# Patient Record
Sex: Female | Born: 1985 | Race: Asian | Hispanic: No | Smoking: Never smoker
Health system: Southern US, Community
[De-identification: ages and names within clinical notes are randomized; demographics above are authoritative.]

## PROBLEM LIST (undated history)

## (undated) DIAGNOSIS — T7840XA Allergy, unspecified, initial encounter: Secondary | ICD-10-CM

## (undated) HISTORY — DX: Allergy, unspecified, initial encounter: T78.40XA

---

## 2013-05-28 ENCOUNTER — Ambulatory Visit (INDEPENDENT_AMBULATORY_CARE_PROVIDER_SITE_OTHER): Payer: Managed Care, Other (non HMO) | Admitting: Emergency Medicine

## 2013-05-28 VITALS — BP 110/70 | HR 80 | Temp 97.9°F | Resp 18 | Ht 66.0 in | Wt 126.0 lb

## 2013-05-28 DIAGNOSIS — K1120 Sialoadenitis, unspecified: Secondary | ICD-10-CM

## 2013-05-28 LAB — POCT CBC
Granulocyte percent: 55.6 %G (ref 37–80)
Hemoglobin: 13.1 g/dL (ref 12.2–16.2)
MCH, POC: 31 pg (ref 27–31.2)
MID (cbc): 0.3 (ref 0–0.9)
MPV: 9.4 fL (ref 0–99.8)
POC MID %: 6.9 %M (ref 0–12)
Platelet Count, POC: 139 10*3/uL — AB (ref 142–424)
RBC: 4.23 M/uL (ref 4.04–5.48)
WBC: 4 10*3/uL — AB (ref 4.6–10.2)

## 2013-05-28 NOTE — Patient Instructions (Addendum)
Salivary Gland Infection  A salivary gland infection can be caused by a virus, bacteria from the mouth, or a stone. Mumps and other viruses may settle in one or more of the saliva glands. This will result in swelling, pain, and difficulty eating. Bacteria may cause a more severe infection in a salivary gland. A salivary stone blocking the flow of saliva can make this worse. These infections may be related to other medical problems. Some of these are dehydration, recent surgery, poor nutrition, and some medications.  TREATMENT   Treatment of a salivary gland infection depends on the cause. Mumps and other virus infections do not require antibiotics. If bacteria cause the infection, then antibiotics are needed to get rid of the infection. If there is a salivary stone blocking the duct, minor surgery to remove the stone may be needed.   HOME CARE INSTRUCTIONS    Get plenty of rest, increase your fluids, and use warm compresses on the swollen area for 15 to 20 minutes 4 times per day or as often as feels good to you.   Suck on hard candy or chew sugarless gum to promote saliva production.   Only take over-the-counter or prescription medicines for pain, discomfort, or fever as directed by your caregiver.  SEEK IMMEDIATE MEDICAL CARE IF:    You have increased swelling or pain or pain not relieved with medications.   You develop chills or a fever.   Any of your problems are getting worse rather than better.  Document Released: 12/25/2004 Document Revised: 02/09/2012 Document Reviewed: 11/17/2005  [employer] Patient Information 2014 [employer].

## 2013-05-28 NOTE — Progress Notes (Signed)
  Subjective:    Patient ID: Erica Mercado, female    DOB: January 08, 1986, 27 y.o.   MRN: [id number]  HPI 27 year old Pt presents to clinic today with Bil pain and swelling behind both ears. This began yesterday morning. No sick contacts at home. Pt moved from [place] almost 5 years ago. She does not recall is she was immunized for the Mumps.    Review of Systems     Objective:   Physical Exam parotid glands are swollen and tender. The TMs are clear. Throat is normal. The neck is otherwise supple. Chest is clear   Results for orders placed in visit on 05/28/13  POCT CBC      Result Value Range   WBC 4.0 (*) 4.6 - 10.2 K/uL   Lymph, poc 1.5  0.6 - 3.4   POC LYMPH PERCENT 37.5  10 - 50 %L   MID (cbc) 0.3  0 - 0.9   POC MID % 6.9  0 - 12 %M   POC Granulocyte 2.2  2 - 6.9   Granulocyte percent 55.6  37 - 80 %G   RBC 4.23  4.04 - 5.48 M/uL   Hemoglobin 13.1  12.2 - 16.2 g/dL   HCT, POC 40.9  81.1 - 47.9 %   MCV 96.8  80 - 97 fL   MCH, POC 31.0  27 - 31.2 pg   MCHC 32.0  31.8 - 35.4 g/dL   RDW, POC 91.4     Platelet Count, POC 139 (*) 142 - 424 K/uL   MPV 9.4  0 - 99.8 fL  POCT RAPID STREP A (OFFICE)      Result Value Range   Rapid Strep A Screen Negative  Negative       Assessment & Plan:  Patient here with bilateral parotiditis she is from [place] and has been here for 5 years. She is unclear about what immunizations she has had. Routine labs were done to include a CBC and an IgM for mumps

## 2013-06-01 LAB — MUMPS ANTIBODY, IGM: Mumps IgM Value: <1:20 {titer}

## 2013-07-04 ENCOUNTER — Ambulatory Visit: Payer: Managed Care, Other (non HMO) | Admitting: Physician Assistant

## 2013-07-04 VITALS — BP 116/70 | HR 65 | Temp 97.5°F | Resp 18 | Ht 66.0 in | Wt 128.0 lb

## 2013-07-04 DIAGNOSIS — L500 Allergic urticaria: Secondary | ICD-10-CM

## 2013-07-04 DIAGNOSIS — L299 Pruritus, unspecified: Secondary | ICD-10-CM

## 2013-07-04 MED ORDER — PREDNISONE 20 MG PO TABS: ORAL_TABLET | ORAL | Status: DC

## 2013-07-04 NOTE — Progress Notes (Signed)
Patient ID: Erica Mercado MRN: [id number], DOB: [birth year], 27 y.o. Date of Encounter: 07/04/2013, 8:19 PM  Primary Physician: No PCP Per Patient  Chief Complaint: Rash x 2 weeks   HPI: 27 y.o. female with history below presents with pruritic rash x 2 weeks. Patient states rash first appeared along her bilateral ears after eating some fish roe that had been shipped to her from her home in [place]. The rash then resolved and she again ate more of the fish roe recently prompting the rash to develop that brought her in tonight. This rash has waxed and waned for several days. It is pruritic in nature. She note lesions along her back, upper abdomen, arms, and sometimes her eyelid, and lower lip. Never with difficulty breathing or swallowing. No new medications, soaps, or detergents. No new clothing. No pets. No one else she is around has a rash. She takes Benadryl for the rash, it helps for the short term, then it returns. Afebrile. No chills. No recent illnesses. Generally healthy.    Past Medical History  Diagnosis Date  . Allergy      Home Meds: Prior to Admission medications   Medication Sig Start Date End Date Taking? Authorizing Provider           Allergies: No Known Allergies  History   Social History  . Marital Status: Unknown    Spouse Name: N/A    Number of Children: N/A  . Years of Education: N/A   Occupational History  . Not on file.   Social History Main Topics  . Smoking status: Never Smoker   . Smokeless tobacco: Not on file  . Alcohol Use: No  . Drug Use: No  . Sexually Active: Not on file   Other Topics Concern  . Not on file   Social History Narrative  . No narrative on file     Review of Systems: Constitutional: negative for chills, fever, or fatigue  HEENT: negative for vision changes, hearing loss, congestion, rhinorrhea, ST, or sinus pressure Cardiovascular: negative for chest pain or palpitations Respiratory: negative for wheezing, shortness of  breath, or cough Abdominal: negative for abdominal pain, nausea, vomiting, or diarrhea Dermatological: See above Neurologic: negative for headache   Physical Exam: Blood pressure 116/70, pulse 65, temperature 97.5 F (36.4 C), temperature source Oral, resp. rate 18, height 5\' 6"  (1.676 m), weight 128 lb (58.06 kg), last menstrual period 06/27/2013, SpO2 100.00%., Body mass index is 20.67 kg/(m^2). General: Well developed, well nourished, in no acute distress. Head: Normocephalic, atraumatic, eyes without discharge, sclera non-icteric, nares are without discharge. Bilateral auditory canals clear, TM's are without perforation, pearly grey and translucent with reflective cone of light bilaterally. Oral cavity moist, posterior pharynx without exudate, erythema, peritonsillar abscess, or post nasal drip.  Neck: Supple. No thyromegaly. Full ROM. No lymphadenopathy. Lungs: Clear bilaterally to auscultation without wheezes, rales, or rhonchi. Breathing is unlabored. Heart: RRR with S1 S2. No murmurs, rubs, or gallops appreciated. Msk:  Strength and tone normal for age. Extremities/Skin: Warm and dry. No clubbing or cyanosis. No edema. Mild urticarial lesions along the posterior chest, isolated lesion on the neck and left arm. Mild angioedema of the lower lip. No swelling or erythema of the tongue or posterior pharynx.   Neuro: Alert and oriented X 3. Moves all extremities spontaneously. Gait is normal. CNII-XII grossly in tact. Psych:  Responds to questions appropriately with a normal affect.     ASSESSMENT AND PLAN:  27 y.o. female with urticaria -  Prednisone 20 mg #18 3x3, 2x3, 1x3 no RF -Avoid fish roe sent from [place]  -RTC precautions   Signed, Eula Listen, PA-C 07/04/2013 8:19 PM

## 2013-07-24 ENCOUNTER — Ambulatory Visit (INDEPENDENT_AMBULATORY_CARE_PROVIDER_SITE_OTHER): Payer: Managed Care, Other (non HMO) | Admitting: Internal Medicine

## 2013-07-24 VITALS — BP 100/52 | HR 77 | Temp 97.9°F | Resp 16 | Ht 66.5 in | Wt 127.2 lb

## 2013-07-24 DIAGNOSIS — L500 Allergic urticaria: Secondary | ICD-10-CM

## 2013-07-24 MED ORDER — PREDNISONE 10 MG PO TABS: ORAL_TABLET | ORAL | Status: DC

## 2013-07-24 MED ORDER — CETIRIZINE HCL 10 MG PO TABS: 10.0000 mg | ORAL_TABLET | Freq: Every day | ORAL | Status: DC

## 2013-07-24 NOTE — Progress Notes (Signed)
  Subjective:    Patient ID: Erica Mercado, female    DOB: 31-Oct-1986, 27 y.o.   MRN: [id number]  HPI She has had a relapse from her last visit of urticarial lesions with lower lip edema She began 4 or 5 days after finishing her steroids No troubles with tongue or with swallowing or breathing No known exposures or foods/no medicines No joint pain No recent illnesses  From [place] Works in a Chief Strategy Officer   Review of Systems Noncontributory    Objective:   Physical Exam BP 100/52  Pulse 77  Temp(Src) 97.9 F (36.6 C) (Oral)  Resp 16  Ht 5' 6.5" (1.689 m)  Wt 127 lb 3.2 oz (57.698 kg)  BMI 20.23 kg/m2  SpO2 100%  LMP 06/27/2013 HEENT clear except for mild swelling of the lower lip Lungs clear Heart regular without murmur Joints are clear Two urticarial lesions on her right hip       Assessment & Plan:  Allergic urticaria  Meds ordered this encounter  Medications  . predniSONE (DELTASONE) 10 MG tablet    Sig: 3/3/2/2/1/1/ then 1/2 tab for 2 more days    Dispense:  13 tablet    Refill:  0  . cetirizine (ZYRTEC) 10 MG tablet    Sig: Take 1 tablet (10 mg total) by mouth daily.    Dispense:  30 tablet    Refill:  11

## 2013-08-10 ENCOUNTER — Ambulatory Visit: Payer: Managed Care, Other (non HMO) | Admitting: Emergency Medicine

## 2013-08-10 VITALS — BP 122/72 | HR 93 | Temp 97.9°F | Resp 18 | Ht 66.0 in | Wt 125.0 lb

## 2013-08-10 DIAGNOSIS — O0001 Abdominal pregnancy with intrauterine pregnancy: Secondary | ICD-10-CM

## 2013-08-10 DIAGNOSIS — N912 Amenorrhea, unspecified: Secondary | ICD-10-CM

## 2013-08-10 LAB — POCT WET PREP WITH KOH
Clue Cells Wet Prep HPF POC: 100
KOH Prep POC: NEGATIVE
Trichomonas, UA: NEGATIVE

## 2013-08-10 LAB — POCT UA - MICROSCOPIC ONLY
Casts, Ur, LPF, POC: NEGATIVE
Crystals, Ur, HPF, POC: NEGATIVE
Yeast, UA: NEGATIVE

## 2013-08-10 LAB — POCT URINE PREGNANCY: Preg Test, Ur: POSITIVE

## 2013-08-10 LAB — POCT URINALYSIS DIPSTICK
Blood, UA: NEGATIVE
Leukocytes, UA: NEGATIVE
Nitrite, UA: NEGATIVE
Protein, UA: NEGATIVE
Urobilinogen, UA: 1
pH, UA: 6

## 2013-08-10 MED ORDER — PRENATAL 27-1 MG PO TABS: 1.0000 | ORAL_TABLET | Freq: Every day | ORAL | Status: DC

## 2013-08-10 NOTE — Progress Notes (Signed)
[facility] [address], [place] [address] [phone]  Date:  08/10/2013   Name:  Erica Mercado   DOB:  1986/05/12   MRN:  [id number]  PCP:  No PCP Per Patient    Chief Complaint: missed cycle 10 days ago and issues with stomach   History of Present Illness:  Erica Mercado is a 27 y.o. very pleasant female patient who presents with the following:  Sexually active and not using contraceptive.  Missed last period 10 days ago.  Nulligravida.  No nausea or vomiting. Has pelvic sensation of "hot" but denies bleeding or discharge.  Difficult to understand if she is complaining of pain.  No improvement with over the counter medications or other home remedies. Denies other complaint or health concern today.   There are no active problems to display for this patient.   Past Medical History  Diagnosis Date  . Allergy     History reviewed. No pertinent past surgical history.  History  Substance Use Topics  . Smoking status: Never Smoker   . Smokeless tobacco: Not on file  . Alcohol Use: No    History reviewed. No pertinent family history.  No Known Allergies  Medication list has been reviewed and updated.  Current Outpatient Prescriptions on File Prior to Visit  Medication Sig Dispense Refill  . cetirizine (ZYRTEC) 10 MG tablet Take 1 tablet (10 mg total) by mouth daily.  30 tablet  11  . predniSONE (DELTASONE) 10 MG tablet 3/3/2/2/1/1/ then 1/2 tab for 2 more days  13 tablet  0   No current facility-administered medications on file prior to visit.    Review of Systems:  As per HPI, otherwise negative.    Physical Examination: Filed Vitals:   08/10/13 1538  BP: 122/72  Pulse: 93  Temp: 97.9 F (36.6 C)  Resp: 18   Filed Vitals:   08/10/13 1538  Height: 5\' 6"  (1.676 m)  Weight: 125 lb (56.7 kg)   Body mass index is 20.19 kg/(m^2). Ideal Body Weight: Weight in (lb) to have BMI = 25: 154.6  GEN: WDWN, NAD, Non-toxic, A & O x 3 HEENT:  Atraumatic, Normocephalic. Neck supple. No masses, No LAD. Ears and Nose: No external deformity. CV: RRR, No M/G/R. No JVD. No thrill. No extra heart sounds. PULM: CTA B, no wheezes, crackles, rhonchi. No retractions. No resp. distress. No accessory muscle use. ABD: S, NT, ND, +BS. No rebound. No HSM. EXTR: No c/c/e NEURO Normal gait.  PSYCH: Normally interactive. Conversant. Not depressed or anxious appearing.  Calm demeanor.  Pelvic - normal external genitalia, vulva, vagina, cervix, uterus and adnexa   Assessment and Plan: Pregnancy  Signed,  Phillips Odor, MD   Results for orders placed in visit on 08/10/13  POCT URINE PREGNANCY      Result Value Range   Preg Test, Ur Positive    POCT URINALYSIS DIPSTICK      Result Value Range   Color, UA yellow     Clarity, UA clear     Glucose, UA neg     Bilirubin, UA neg     Ketones, UA 40     Spec Grav, UA >=1.030     Blood, UA neg     pH, UA 6.0     Protein, UA neg     Urobilinogen, UA 1.0     Nitrite, UA neg     Leukocytes, UA Negative    POCT UA - MICROSCOPIC ONLY  Result Value Range   WBC, Ur, HPF, POC 0-1     RBC, urine, microscopic neg     Bacteria, U Microscopic neg     Mucus, UA neg     Epithelial cells, urine per micros 0-1     Crystals, Ur, HPF, POC neg     Casts, Ur, LPF, POC neg     Yeast, UA neg     Amorphous small

## 2013-08-10 NOTE — Patient Instructions (Addendum)

## 2013-08-11 LAB — GC/CHLAMYDIA PROBE AMP
CT Probe RNA: NEGATIVE
GC Probe RNA: NEGATIVE

## 2013-12-01 NOTE — L&D Delivery Note (Signed)
Delivery Note At 1:04 PM a viable female was delivered via Vaginal, Spontaneous Delivery (Presentation: ; Occiput Anterior).  APGAR: 9, 9; weight  .   Placenta status: Intact, Spontaneous.  Cord: 3 vessels with the following complications: None.  Cord pH: not indicated  Anesthesia: Epidural  Episiotomy: None Lacerations: 2nd degree;Sulcus Suture Repair: 2.0 3.0 vicryl rapide Est. Blood Loss (mL):  400  Mom to postpartum.  Baby to Couplet care / Skin to Skin.  Hatsue Sime A. 11/23/2014, 1:27 PM

## 2014-01-02 ENCOUNTER — Ambulatory Visit: Payer: BC Managed Care – PPO | Admitting: Physician Assistant

## 2014-01-02 VITALS — BP 108/76 | HR 90 | Temp 98.6°F | Resp 18 | Ht 65.0 in | Wt 127.0 lb

## 2014-01-02 DIAGNOSIS — N898 Other specified noninflammatory disorders of vagina: Secondary | ICD-10-CM

## 2014-01-02 DIAGNOSIS — B373 Candidiasis of vulva and vagina: Secondary | ICD-10-CM

## 2014-01-02 DIAGNOSIS — B3731 Acute candidiasis of vulva and vagina: Secondary | ICD-10-CM

## 2014-01-02 DIAGNOSIS — Z309 Encounter for contraceptive management, unspecified: Secondary | ICD-10-CM

## 2014-01-02 DIAGNOSIS — R109 Unspecified abdominal pain: Secondary | ICD-10-CM

## 2014-01-02 DIAGNOSIS — R300 Dysuria: Secondary | ICD-10-CM

## 2014-01-02 LAB — POCT UA - MICROSCOPIC ONLY
Bacteria, U Microscopic: NEGATIVE
Casts, Ur, LPF, POC: NEGATIVE
Crystals, Ur, HPF, POC: NEGATIVE
Mucus, UA: NEGATIVE
Yeast, UA: NEGATIVE

## 2014-01-02 LAB — POCT WET PREP WITH KOH
Clue Cells Wet Prep HPF POC: NEGATIVE
KOH Prep POC: POSITIVE
RBC Wet Prep HPF POC: NEGATIVE
Trichomonas, UA: NEGATIVE
Yeast Wet Prep HPF POC: POSITIVE

## 2014-01-02 LAB — POCT URINALYSIS DIPSTICK
Bilirubin, UA: NEGATIVE
Blood, UA: NEGATIVE
Glucose, UA: NEGATIVE
Ketones, UA: NEGATIVE
Leukocytes, UA: NEGATIVE
Nitrite, UA: NEGATIVE
Protein, UA: NEGATIVE
Spec Grav, UA: 1.02
Urobilinogen, UA: 0.2
pH, UA: 7

## 2014-01-02 LAB — POCT URINE PREGNANCY: Preg Test, Ur: NEGATIVE

## 2014-01-02 MED ORDER — NORGESTIM-ETH ESTRAD TRIPHASIC 0.18/0.215/0.25 MG-35 MCG PO TABS: 1.0000 | ORAL_TABLET | Freq: Every day | ORAL | Status: DC

## 2014-01-02 MED ORDER — FLUCONAZOLE 150 MG PO TABS: 150.0000 mg | ORAL_TABLET | Freq: Once | ORAL | Status: DC

## 2014-01-02 NOTE — Progress Notes (Signed)
Subjective:    Patient ID: Antony Salmon, female    DOB: [birth year], 28 y.o.   MRN: [id number]  HPI 28 year old female presents for evaluation of 2 day history of vaginal pruritis and slight white discharge.  States symptoms have gradually worsened and she complains of slight lower abdominal pain.  Denies nausea, vomiting, urinary frequency, fever, chills, vaginal odor, or dyspareunia.  She does have dysuria.  No rash or lesions.  Patient is sexually with 1 female partner - her husband. No concern of STI's and does not wish to be tested for them today. LNMP 12/20/13. Not using contraception. Not trying to become pregnant.  Patient does request OCP's today. Last pap unknown.   No OTC treatments tried.  Patient is otherwise healthy with no other concerns today.     Review of Systems  Constitutional: Negative for fever and chills.  Gastrointestinal: Positive for abdominal pain. Negative for nausea and vomiting.  Genitourinary: Positive for dysuria and vaginal discharge. Negative for frequency, flank pain, vaginal bleeding, difficulty urinating, vaginal pain and menstrual problem.       Objective:   Physical Exam  Constitutional: She is oriented to person, place, and time. She appears well-developed and well-nourished.  HENT:  Head: Normocephalic and atraumatic.  Right Ear: External ear normal.  Left Ear: External ear normal.  Eyes: Conjunctivae are normal.  Neck: Normal range of motion.  Cardiovascular: Normal rate, regular rhythm and normal heart sounds.   Pulmonary/Chest: Effort normal and breath sounds normal.  Abdominal: Soft. Bowel sounds are normal. There is no tenderness. There is no rebound and no guarding.  Genitourinary: Uterus normal. Cervix exhibits discharge. Cervix exhibits no motion tenderness and no friability. Right adnexum displays no mass, no tenderness and no fullness. Left adnexum displays no mass, no tenderness and no fullness. Vaginal discharge (thick, white discharge  at introitus) found.  Neurological: She is alert and oriented to person, place, and time.  Psychiatric: She has a normal mood and affect. Her behavior is normal. Judgment and thought content normal.     Results for orders placed in visit on 01/02/14  POCT WET PREP WITH KOH      Result Value Range   Trichomonas, UA Negative     Clue Cells Wet Prep HPF POC neg     Epithelial Wet Prep HPF POC 2-3     Yeast Wet Prep HPF POC pos     Bacteria Wet Prep HPF POC trace     RBC Wet Prep HPF POC neg     WBC Wet Prep HPF POC 0-1     KOH Prep POC Positive    POCT URINE PREGNANCY      Result Value Range   Preg Test, Ur Negative    POCT URINALYSIS DIPSTICK      Result Value Range   Color, UA bright yellow     Clarity, UA clear     Glucose, UA neg     Bilirubin, UA neg     Ketones, UA neg     Spec Grav, UA 1.020     Blood, UA neg     pH, UA 7.0     Protein, UA neg     Urobilinogen, UA 0.2     Nitrite, UA neg     Leukocytes, UA Negative    POCT UA - MICROSCOPIC ONLY      Result Value Range   WBC, Ur, HPF, POC 0-2     RBC, urine, microscopic 0-1  Bacteria, U Microscopic neg     Mucus, UA neg     Epithelial cells, urine per micros 0-1     Crystals, Ur, HPF, POC neg     Casts, Ur, LPF, POC neg     Yeast, UA neg          Assessment & Plan:  Candida vaginitis - Plan: fluconazole (DIFLUCAN) 150 MG tablet  Dysuria - Plan: POCT urinalysis dipstick, POCT UA - Microscopic Only  Leukorrhea, not specified as infective - Plan: POCT Wet Prep with KOH  Abdominal pain, unspecified site - Plan: POCT urine pregnancy  Contraception management - Plan: Norgestimate-Ethinyl Estradiol Triphasic (ORTHO TRI-CYCLEN, 28,) 0.18/0.215/0.25 MG-35 MCG tablet  Vaginal candidiasis - diflucan 150 mg po once today. May repeat in 7 days if symptoms persist Patient requested OCP's after exam. Pap not done today. Provided 1 month supply and instructed her to return here or to 104 for well woman exam.     Follow up if symptoms worsen or fail to improve.

## 2014-04-02 ENCOUNTER — Ambulatory Visit (INDEPENDENT_AMBULATORY_CARE_PROVIDER_SITE_OTHER): Payer: BC Managed Care – PPO | Admitting: Family Medicine

## 2014-04-02 VITALS — BP 102/64 | HR 70 | Temp 98.3°F | Resp 12 | Ht 65.0 in | Wt 133.0 lb

## 2014-04-02 DIAGNOSIS — Z3200 Encounter for pregnancy test, result unknown: Secondary | ICD-10-CM

## 2014-04-02 DIAGNOSIS — Z3490 Encounter for supervision of normal pregnancy, unspecified, unspecified trimester: Secondary | ICD-10-CM

## 2014-04-02 LAB — POCT URINE PREGNANCY: Preg Test, Ur: POSITIVE

## 2014-04-02 NOTE — Progress Notes (Signed)
Subjective:  This chart was scribed for Erica SimmerKristi Smith, MD by Carl Bestelina Holson, Medical Scribe. This patient was seen in Room 13 and the patient's care was started at 9:53 AM.   Patient ID: Erica Mercado, female    DOB: 02/15/1986, 28 y.o.   MRN: [id number]  HPI HPI Comments: Erica SalmonSaran Kinsel is a 28 y.o. female who presents to the [facility] wanting a pregnancy test.  The patient states that her LNMP was February 15, 2014.  The patient states that she is trying to get pregnant.  She states that she did one home pregnancy test but wanted to make sure it was not a false positive.  The patient states that she has been pregnant once before.  She states that she does not have an OB/GYN.  The patient states that she is not taking a prenatal vitamin.  She denies nausea and breast tenderness as associated symptoms.  The patient states that she takes Zyrtec daily.  She denies exercising regularly.  She denies having a history of chronic health problems or surgeries.  The patient states that she has a history of one miscarriage.  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  The patient is married and from [place] originally.   Past Medical History  Diagnosis Date  . Allergy    History reviewed. No pertinent past surgical history. History reviewed. No pertinent family history. History   Social History  . Marital Status: Unknown    Spouse Name: N/A    Number of Children: N/A  . Years of Education: N/A   Occupational History  . Not on file.   Social History Main Topics  . Smoking status: Never Smoker   . Smokeless tobacco: Not on file  . Alcohol Use: No  . Drug Use: No  . Sexual Activity: Yes   Other Topics Concern  . Not on file   Social History Narrative  . No narrative on file   No Known Allergies  Review of Systems  Constitutional: Negative for chills, diaphoresis and fatigue.  Gastrointestinal: Negative for nausea.  Genitourinary: Positive for menstrual problem.  All other  systems reviewed and are negative.    Objective:  Physical Exam  Nursing note and vitals reviewed. Constitutional: She is oriented to person, place, and time. She appears well-developed and well-nourished. No distress.  HENT:  Head: Normocephalic and atraumatic.  Eyes: Conjunctivae and EOM are normal. Pupils are equal, round, and reactive to light.  Neck: Normal range of motion.  Cardiovascular: Normal rate, regular rhythm and normal heart sounds.  Exam reveals no gallop and no friction rub.   No murmur heard. Pulmonary/Chest: Effort normal. She has no wheezes. She has no rales. She exhibits no tenderness.  Musculoskeletal: Normal range of motion.  Neurological: She is alert and oriented to person, place, and time.  Skin: Skin is warm and dry. She is not diaphoretic.  Psychiatric: She has a normal mood and affect. Her behavior is normal.   Results for orders placed in visit on 04/02/14  POCT URINE PREGNANCY      Result Value Ref Range   Preg Test, Ur Positive       BP 102/64  Pulse 70  Temp(Src) 98.3 F (36.8 C) (Oral)  Resp 12  Ht 5\' 5"  (1.651 m)  Wt 133 lb (60.328 kg)  BMI 22.13 kg/m2  SpO2 99%  LMP 02/15/2014 Assessment & Plan:  Possible pregnancy, not yet confirmed - Plan: POCT urine pregnancy  Pregnancy -  Plan: Ambulatory referral to Obstetrics / Gynecology  1.  Amenorrhea:  New. Secondary to pregnancy. 2.  Pregnancy:  New.  LMP 02-13-14.  Start PNV one tablet qhs.  Refer to OB/GYN per patient request.  Approximately six weeks gestation.  Counseling provided regarding avoidance of OTC medications until consultation with OB/GYN.  Also reviewed pregnancy safe foods.  I personally performed the services described in this documentation, which was scribed in my presence.  The recorded information has been reviewed and is accurate.  Erica Mercado, M.D.  [facility]  [place] [address] [place], [place]  [id number] [phone] phone [phone])  [phone] fax
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
## 2014-04-12 LAB — OB RESULTS CONSOLE HEPATITIS B SURFACE ANTIGEN: Hepatitis B Surface Ag: NEGATIVE

## 2014-04-12 LAB — OB RESULTS CONSOLE ABO/RH: RH Type: POSITIVE

## 2014-04-12 LAB — OB RESULTS CONSOLE GC/CHLAMYDIA
CHLAMYDIA, DNA PROBE: NEGATIVE
Gonorrhea: NEGATIVE

## 2014-04-12 LAB — OB RESULTS CONSOLE RPR: RPR: NONREACTIVE

## 2014-04-12 LAB — OB RESULTS CONSOLE ANTIBODY SCREEN: ANTIBODY SCREEN: NEGATIVE

## 2014-04-12 LAB — OB RESULTS CONSOLE RUBELLA ANTIBODY, IGM: RUBELLA: IMMUNE

## 2014-04-12 LAB — OB RESULTS CONSOLE HIV ANTIBODY (ROUTINE TESTING): HIV: NONREACTIVE

## 2014-07-30 ENCOUNTER — Other Ambulatory Visit: Payer: Self-pay | Admitting: Internal Medicine

## 2014-10-07 ENCOUNTER — Other Ambulatory Visit: Payer: Self-pay | Admitting: Physician Assistant

## 2014-11-01 LAB — OB RESULTS CONSOLE GBS: GBS: NEGATIVE

## 2014-11-07 ENCOUNTER — Other Ambulatory Visit: Payer: Self-pay | Admitting: Physician Assistant

## 2014-11-20 ENCOUNTER — Other Ambulatory Visit: Payer: Self-pay | Admitting: Obstetrics and Gynecology

## 2014-11-20 ENCOUNTER — Other Ambulatory Visit: Payer: Self-pay | Admitting: Physician Assistant

## 2014-11-22 ENCOUNTER — Inpatient Hospital Stay (HOSPITAL_COMMUNITY)
Admission: AD | Admit: 2014-11-22 | Discharge: 2014-11-25 | DRG: 775 | Disposition: A | Discharge status: Home / Self Care | Payer: BC Managed Care – PPO | Source: Ambulatory Visit | Attending: Obstetrics and Gynecology | Admitting: Obstetrics and Gynecology

## 2014-11-22 ENCOUNTER — Telehealth (HOSPITAL_COMMUNITY): Payer: Self-pay | Admitting: *Deleted

## 2014-11-22 ENCOUNTER — Encounter (HOSPITAL_COMMUNITY): Payer: Self-pay | Admitting: *Deleted

## 2014-11-22 DIAGNOSIS — Z3A40 40 weeks gestation of pregnancy: Secondary | ICD-10-CM | POA: Diagnosis present

## 2014-11-22 DIAGNOSIS — IMO0001 Reserved for inherently not codable concepts without codable children: Secondary | ICD-10-CM

## 2014-11-22 LAB — POCT FERN TEST: POCT FERN TEST: POSITIVE

## 2014-11-22 NOTE — MAU Note (Signed)
Pt reports ROM at 2300, some contractions.

## 2014-11-22 NOTE — Telephone Encounter (Signed)
Preadmission screen  

## 2014-11-23 ENCOUNTER — Inpatient Hospital Stay (HOSPITAL_COMMUNITY): Payer: BC Managed Care – PPO | Admitting: Anesthesiology

## 2014-11-23 ENCOUNTER — Encounter (HOSPITAL_COMMUNITY): Payer: Self-pay | Admitting: *Deleted

## 2014-11-23 DIAGNOSIS — Z3A40 40 weeks gestation of pregnancy: Secondary | ICD-10-CM | POA: Diagnosis present

## 2014-11-23 DIAGNOSIS — IMO0001 Reserved for inherently not codable concepts without codable children: Secondary | ICD-10-CM

## 2014-11-23 LAB — CBC
HCT: 40 % (ref 36.0–46.0)
HEMOGLOBIN: 13.3 g/dL (ref 12.0–15.0)
MCH: 29.4 pg (ref 26.0–34.0)
MCHC: 33.3 g/dL (ref 30.0–36.0)
MCV: 88.5 fL (ref 78.0–100.0)
Platelets: 189 10*3/uL (ref 150–400)
RBC: 4.52 MIL/uL (ref 3.87–5.11)
RDW: 13.6 % (ref 11.5–15.5)
WBC: 10 10*3/uL (ref 4.0–10.5)

## 2014-11-23 LAB — TYPE AND SCREEN
ABO/RH(D): A POS
Antibody Screen: NEGATIVE

## 2014-11-23 LAB — RPR

## 2014-11-23 LAB — ABO/RH: ABO/RH(D): A POS

## 2014-11-23 MED ORDER — DIPHENHYDRAMINE HCL 25 MG PO CAPS: 25.0000 mg | ORAL_CAPSULE | Freq: Four times a day (QID) | ORAL | Status: DC | PRN

## 2014-11-23 MED ORDER — ONDANSETRON HCL 4 MG PO TABS: 4.0000 mg | ORAL_TABLET | ORAL | Status: DC | PRN

## 2014-11-23 MED ORDER — ONDANSETRON HCL 4 MG/2ML IJ SOLN: 4.0000 mg | INTRAMUSCULAR | Status: DC | PRN

## 2014-11-23 MED ORDER — OXYTOCIN BOLUS FROM INFUSION: 500.0000 mL | INTRAVENOUS | Status: DC

## 2014-11-23 MED ORDER — DIBUCAINE 1 % RE OINT: 1.0000 "application " | TOPICAL_OINTMENT | RECTAL | Status: DC | PRN

## 2014-11-23 MED ORDER — OXYCODONE-ACETAMINOPHEN 5-325 MG PO TABS: 1.0000 | ORAL_TABLET | ORAL | Status: DC | PRN

## 2014-11-23 MED ORDER — LIDOCAINE HCL (PF) 1 % IJ SOLN
INTRAMUSCULAR | Status: DC | PRN
  Administered 2014-11-23 (×2): 5 mL

## 2014-11-23 MED ORDER — SENNOSIDES-DOCUSATE SODIUM 8.6-50 MG PO TABS
2.0000 | ORAL_TABLET | ORAL | Status: DC
  Administered 2014-11-23 – 2014-11-25 (×2): 2 via ORAL
  Filled 2014-11-23 (×2): qty 2

## 2014-11-23 MED ORDER — FENTANYL 2.5 MCG/ML BUPIVACAINE 1/10 % EPIDURAL INFUSION (WH - ANES)
INTRAMUSCULAR | Status: DC | PRN
  Administered 2014-11-23: 14 mL/h via EPIDURAL

## 2014-11-23 MED ORDER — TERBUTALINE SULFATE 1 MG/ML IJ SOLN: 0.2500 mg | Freq: Once | INTRAMUSCULAR | Status: DC | PRN

## 2014-11-23 MED ORDER — PHENYLEPHRINE 40 MCG/ML (10ML) SYRINGE FOR IV PUSH (FOR BLOOD PRESSURE SUPPORT)
80.0000 ug | PREFILLED_SYRINGE | INTRAVENOUS | Status: DC | PRN
  Filled 2014-11-23: qty 10
  Filled 2014-11-23: qty 2

## 2014-11-23 MED ORDER — CITRIC ACID-SODIUM CITRATE 334-500 MG/5ML PO SOLN: 30.0000 mL | ORAL | Status: DC | PRN

## 2014-11-23 MED ORDER — EPHEDRINE 5 MG/ML INJ
10.0000 mg | INTRAVENOUS | Status: DC | PRN
  Filled 2014-11-23: qty 2

## 2014-11-23 MED ORDER — FLEET ENEMA 7-19 GM/118ML RE ENEM: 1.0000 | ENEMA | Freq: Every day | RECTAL | Status: DC | PRN

## 2014-11-23 MED ORDER — ACETAMINOPHEN 325 MG PO TABS: 650.0000 mg | ORAL_TABLET | ORAL | Status: DC | PRN

## 2014-11-23 MED ORDER — BISACODYL 10 MG RE SUPP: 10.0000 mg | Freq: Every day | RECTAL | Status: DC | PRN

## 2014-11-23 MED ORDER — LANOLIN HYDROUS EX OINT: TOPICAL_OINTMENT | CUTANEOUS | Status: DC | PRN

## 2014-11-23 MED ORDER — LACTATED RINGERS IV SOLN
500.0000 mL | Freq: Once | INTRAVENOUS | Status: AC
  Administered 2014-11-23: 500 mL via INTRAVENOUS

## 2014-11-23 MED ORDER — LACTATED RINGERS IV SOLN
INTRAVENOUS | Status: DC
  Administered 2014-11-23 (×3): via INTRAVENOUS

## 2014-11-23 MED ORDER — BENZOCAINE-MENTHOL 20-0.5 % EX AERO
1.0000 "application " | INHALATION_SPRAY | CUTANEOUS | Status: DC | PRN
  Administered 2014-11-24: 1 via TOPICAL
  Filled 2014-11-23: qty 56

## 2014-11-23 MED ORDER — DIPHENHYDRAMINE HCL 50 MG/ML IJ SOLN: 12.5000 mg | INTRAMUSCULAR | Status: DC | PRN

## 2014-11-23 MED ORDER — SIMETHICONE 80 MG PO CHEW: 80.0000 mg | CHEWABLE_TABLET | ORAL | Status: DC | PRN

## 2014-11-23 MED ORDER — LIDOCAINE HCL (PF) 1 % IJ SOLN
30.0000 mL | INTRAMUSCULAR | Status: DC | PRN
  Filled 2014-11-23: qty 30

## 2014-11-23 MED ORDER — ONDANSETRON HCL 4 MG/2ML IJ SOLN: 4.0000 mg | Freq: Four times a day (QID) | INTRAMUSCULAR | Status: DC | PRN

## 2014-11-23 MED ORDER — OXYTOCIN 40 UNITS IN LACTATED RINGERS INFUSION - SIMPLE MED
62.5000 mL/h | INTRAVENOUS | Status: DC
  Filled 2014-11-23: qty 1000

## 2014-11-23 MED ORDER — PHENYLEPHRINE 40 MCG/ML (10ML) SYRINGE FOR IV PUSH (FOR BLOOD PRESSURE SUPPORT)
80.0000 ug | PREFILLED_SYRINGE | INTRAVENOUS | Status: DC | PRN
  Administered 2014-11-23 (×2): 80 ug via INTRAVENOUS
  Filled 2014-11-23: qty 2

## 2014-11-23 MED ORDER — LACTATED RINGERS IV SOLN
500.0000 mL | INTRAVENOUS | Status: DC | PRN
  Administered 2014-11-23: 500 mL via INTRAVENOUS

## 2014-11-23 MED ORDER — FENTANYL 2.5 MCG/ML BUPIVACAINE 1/10 % EPIDURAL INFUSION (WH - ANES)
14.0000 mL/h | INTRAMUSCULAR | Status: DC | PRN
  Administered 2014-11-23 (×2): 14 mL/h via EPIDURAL
  Filled 2014-11-23 (×2): qty 125

## 2014-11-23 MED ORDER — OXYCODONE-ACETAMINOPHEN 5-325 MG PO TABS
2.0000 | ORAL_TABLET | ORAL | Status: DC | PRN
Start: 2014-11-23 — End: 2014-11-25

## 2014-11-23 MED ORDER — ZOLPIDEM TARTRATE 5 MG PO TABS: 5.0000 mg | ORAL_TABLET | Freq: Every evening | ORAL | Status: DC | PRN

## 2014-11-23 MED ORDER — IBUPROFEN 600 MG PO TABS
600.0000 mg | ORAL_TABLET | Freq: Four times a day (QID) | ORAL | Status: DC
  Administered 2014-11-23 – 2014-11-25 (×7): 600 mg via ORAL
  Filled 2014-11-23 (×8): qty 1

## 2014-11-23 MED ORDER — FLEET ENEMA 7-19 GM/118ML RE ENEM: 1.0000 | ENEMA | RECTAL | Status: DC | PRN

## 2014-11-23 MED ORDER — PRENATAL MULTIVITAMIN CH
1.0000 | ORAL_TABLET | Freq: Every day | ORAL | Status: DC
  Administered 2014-11-24 – 2014-11-25 (×2): 1 via ORAL
  Filled 2014-11-23 (×2): qty 1

## 2014-11-23 MED ORDER — TETANUS-DIPHTH-ACELL PERTUSSIS 5-2.5-18.5 LF-MCG/0.5 IM SUSP: 0.5000 mL | Freq: Once | INTRAMUSCULAR | Status: DC

## 2014-11-23 MED ORDER — OXYCODONE-ACETAMINOPHEN 5-325 MG PO TABS: 2.0000 | ORAL_TABLET | ORAL | Status: DC | PRN

## 2014-11-23 MED ORDER — OXYTOCIN 40 UNITS IN LACTATED RINGERS INFUSION - SIMPLE MED
1.0000 m[IU]/min | INTRAVENOUS | Status: DC
  Administered 2014-11-23: 2 m[IU]/min via INTRAVENOUS
  Administered 2014-11-23: 4 m[IU]/min via INTRAVENOUS

## 2014-11-23 MED ORDER — WITCH HAZEL-GLYCERIN EX PADS: 1.0000 "application " | MEDICATED_PAD | CUTANEOUS | Status: DC | PRN

## 2014-11-23 NOTE — H&P (Signed)
Antony SalmonSaran Onofre is a 28 y.o. G2P0010 at 5280w1d presenting for ROM. Pt notes onset contractions after ROM . Good fetal movement, No vaginal bleeding, started leaking fluid at 11p.  [facility] - dated by LMP c/s 9 wk u/s - growth at 37 wks- 5'14, 35% - GBS neg - 40# wt gain   Prenatal Transfer Tool  Maternal Diabetes: No Genetic Screening: Normal Maternal Ultrasounds/Referrals: Normal Fetal Ultrasounds or other Referrals:  None Maternal Substance Abuse:  No Significant Maternal Medications:  None Significant Maternal Lab Results: None     OB History    Gravida Para Term Preterm AB TAB SAB Ectopic Multiple Living   2    1  1         Past Medical History  Diagnosis Date  . Allergy    No past surgical history on file. Family History: family history is not on file. Social History:  reports that she has never smoked. She does not have any smokeless tobacco history on file. She reports that she does not drink alcohol or use illicit drugs.  Review of Systems - Negative except LOF   Dilation: 3.5 Effacement (%): 90 Station: -2 Exam by:: Rosebud PolesGloria Payne RN Blood pressure 103/58, pulse 94, temperature 98 F (36.7 C), temperature source Oral, resp. rate 18, height 5\' 5"  (1.651 m), weight 75.751 kg (167 lb), last menstrual period 02/15/2014, SpO2 100 %.  Physical Exam:  Exam deferred Toco: q 2-5 min FH: baseline 135s, accelerations present, no deceleratons, 10 beat variability  Prenatal labs: ABO, Rh: --/--/A POS (12/24 0050) Antibody: NEG (12/24 0050) Rubella:  immune RPR: Nonreactive (05/13 0000)  HBsAg: Negative (05/13 0000)  HIV: Non-reactive (05/13 0000)  GBS: Negative (12/02 0000)  1 hr Glucola 77  Genetic screening nl quad Anatomy US normal   Assessment/Plan: 28 y.o. G2P0010 at 180w1d ROM at term, contracting on own. Allow expectant management for up to 12 hrs. If not actively making change by then will plan pitocin IO. - Reactive fetal  testing - Epidural   Katyra Tomassetti A. 11/23/2014, 3:38 AM

## 2014-11-23 NOTE — Progress Notes (Signed)
S: Doing well, no complaints, pain well controlled with epidural, has been sleeping overnight, no complaints this am  O: BP 108/59 mmHg  Pulse 109  Temp(Src) 99.8 F (37.7 C) (Oral)  Resp 20  Ht 5\' 5"  (1.651 m)  Wt 75.751 kg (167 lb)  BMI 27.79 kg/m2  SpO2 100%  LMP 02/15/2014   FHT:  FHR: 155 bpm, variability: moderate,  accelerations:  Present,  decelerations:  Absent UC:   regular, every 3 minutes SVE:   Dilation: Lip/rim Effacement (%): 90 Station: +1 Exam by:: Zipporah Finamore  Bloody mucous per vagina, slightly more than normal 'bloody show'   A / P:  28 y.o.  Obstetric History   G2   P0   T0   P0   A1   TAB0   SAB1   E0   M0   L0    at 45106w1d SROM with active labor, continue expectant management, good progress thus far  Fetal Wellbeing:  Category I Pain Control:  Epidural  Anticipated MOD:  NSVD  Lalo Tromp A. 11/23/2014, 8:29 AM

## 2014-11-23 NOTE — Anesthesia Preprocedure Evaluation (Signed)

## 2014-11-23 NOTE — Anesthesia Procedure Notes (Signed)
Epidural Patient location during procedure: OB Start time: 11/23/2014 1:58 AM  Staffing Anesthesiologist: Brayton CavesJACKSON, Kirston Luty Performed by: anesthesiologist   Preanesthetic Checklist Completed: patient identified, site marked, surgical consent, pre-op evaluation, timeout performed, IV checked, risks and benefits discussed and monitors and equipment checked  Epidural Patient position: sitting Prep: site prepped and draped and DuraPrep Patient monitoring: continuous pulse ox and blood pressure Approach: midline Location: L3-L4 Injection technique: LOR air  Needle:  Needle type: Tuohy  Needle gauge: 17 G Needle length: 9 cm and 9 Needle insertion depth: 5 cm cm Catheter type: closed end flexible Catheter size: 19 Gauge Catheter at skin depth: 10 cm Test dose: negative  Assessment Events: blood not aspirated, injection not painful, no injection resistance, negative IV test and no paresthesia  Additional Notes Patient identified.  Risk benefits discussed including failed block, incomplete pain control, headache, nerve damage, paralysis, blood pressure changes, nausea, vomiting, reactions to medication both toxic or allergic, and postpartum back pain.  Patient expressed understanding and wished to proceed.  All questions were answered.  Sterile technique used throughout procedure and epidural site dressed with sterile barrier dressing. No paresthesia or other complications noted.The patient did not experience any signs of intravascular injection such as tinnitus or metallic taste in mouth nor signs of intrathecal spread such as rapid motor block. Please see nursing notes for vital signs.

## 2014-11-23 NOTE — Lactation Note (Signed)
This note was copied from the chart of Girl Zalea Braatz. Lactation Consultation Note  Patient Name: Girl Antony SalmonSaran Dunkerson WUJWJ'XToday's Date: 11/23/2014 Reason for consult: Initial assessment of this mom and bay, at 8 hours pp.  Mom had intended to both breast and bottle-feed because of return to work after 6 weeks.  LC encouraged exclusive breastfeeding at least 2 weeks before introducing formula and bottle-feeding, if possible.  LC reviewed LEAD cautions.  RN has taught mom hand expression (documented at 1500) and baby has fed several times but for only a few minutes due to sleepiness.  Baby is currently STS and asleep with no sign of cues to feed.  LC encouraged continued STS and watching for cues.  Mom encouraged to feed baby 8-12 times/24 hours and with feeding cues. LC encouraged review of Baby and Me pp 9, 14 and 20-25 for STS and BF information. LC provided [employer] Resource brochure and reviewed [facility] services and list of community and web site resources.    Maternal Data Formula Feeding for Exclusion: Yes Reason for exclusion: Mother's choice to formula and breast feed on admission Has patient been taught Hand Expression?: Yes (documented by RN at 1500) Does the patient have breastfeeding experience prior to this delivery?: No  Feeding Feeding Type: Breast Fed Length of feed:  (attempt)  LATCH Score/Interventions           LATCH score=6 due to baby being sleepy; drops of colostrum were hand expressed, with assistance of RN           Lactation Tools Discussed/Used   STS, cue feedings, hand expression LEAD cautions regarding early supplementation  Consult Status Consult Status: Follow-up Date: 11/24/14 Follow-up type: In-patient    Warrick ParisianBryant, Mckinsley Koelzer [facility] 11/23/2014, 9:20 PM

## 2014-11-24 LAB — CBC
HEMATOCRIT: 35.5 % — AB (ref 36.0–46.0)
Hemoglobin: 11.5 g/dL — ABNORMAL LOW (ref 12.0–15.0)
MCH: 28.9 pg (ref 26.0–34.0)
MCHC: 32.4 g/dL (ref 30.0–36.0)
MCV: 89.2 fL (ref 78.0–100.0)
Platelets: 164 10*3/uL (ref 150–400)
RBC: 3.98 MIL/uL (ref 3.87–5.11)
RDW: 13.8 % (ref 11.5–15.5)
WBC: 14.8 10*3/uL — ABNORMAL HIGH (ref 4.0–10.5)

## 2014-11-24 NOTE — Lactation Note (Signed)
This note was copied from the chart of Erica Modest Shurley. Lactation Consultation Note  Mom reports that BF is not going well.  I was not able to see baby attach to the bare breast.  She seems to have difficulty extending her tongue to grasp the nipple and difficulty elevating her tongue to compress the breast.  I was able to advance a gloved finger to the hard and soft palate juncture though she gagged.  A #20 nipple was initiated to see if greater depth would encourage suckling.  She attached to the breast with a #20 NS pre-filled with formula but only suckled non-nutritively and then fell asleep.  She did not even transfer all of what was in the shield.  Mother reports that she desires to BF and plans to get a double electric pump once at home.  She did state that she would also use formula but wants the bulk of the feedings to be BM.  Plan for tonight is to put the baby to breast.  Follow-up with formula or expressed BM and post-pump for 15 minutes. Patient Name: Erica Mercado ZOXWR'UToday's Date: 11/24/2014 Reason for consult: Follow-up assessment   Maternal Data    Feeding Feeding Type: Formula  LATCH Score/Interventions Latch: Repeated attempts needed to sustain latch, nipple held in mouth throughout feeding, stimulation needed to elicit sucking reflex. Intervention(s): Adjust position;Assist with latch;Breast compression  Audible Swallowing: None  Type of Nipple: Everted at rest and after stimulation  Comfort (Breast/Nipple): Soft / non-tender     Hold (Positioning): Assistance needed to correctly position infant at breast and maintain latch.  LATCH Score: 6  Lactation Tools Discussed/Used Tools: Nipple Shields Nipple shield size: 20 Pump Review: Setup, frequency, and cleaning Initiated by:: Erica Mercado Date initiated:: 11/24/14   Consult Status Consult Status: Follow-up Date: 11/25/14 Follow-up type: In-patient    Erica Mercado, Erica Mercado 11/24/2014, 9:29 PM

## 2014-11-24 NOTE — Progress Notes (Signed)
PPD 1 SVD  S:  Reports feeling well - plans DC tomorrow             Tolerating po/ No nausea or vomiting             Bleeding is light             Pain controlled with motrin             Up ad lib / ambulatory / voiding QS  Newborn breast feeding  O:               VS: BP 95/57 mmHg  Pulse 67  Temp(Src) 97.7 F (36.5 C) (Oral)  Resp 20  Ht 5\' 5"  (1.651 m)  Wt 75.751 kg (167 lb)  BMI 27.79 kg/m2  SpO2 100%  LMP 02/15/2014  Breastfeeding? Unknown   LABS:              Recent Labs  11/23/14 0050 11/24/14 0602  WBC 10.0 14.8*  HGB 13.3 11.5*  PLT 189 164               Blood type: --/--/A POS, A POS (12/24 0050)  Rubella: Immune (05/13 0000)                     I&O: Intake/Output      12/24 0701 - 12/25 0700 12/25 0701 - 12/26 0700   Urine (mL/kg/hr) 1000 (0.6)    Blood 400 (0.2)    Total Output 1400     Net -1400          Urine Occurrence 1 x                  Physical Exam:             Alert and oriented X3  Abdomen: soft, non-tender, non-distended              Fundus: firm, non-tender, U-2  Perineum: mild edema  Lochia: light  Extremities: no edema, no calf pain or tenderness    A: PPD # 1   Doing well - stable status  P: Routine post partum orders  Dc tomorrow  Marlinda MikeBAILEY, Laquiesha Piacente CNM, MSN, [facility]ACNM 11/24/2014, 11:42 AM

## 2014-11-24 NOTE — Anesthesia Postprocedure Evaluation (Signed)
  Anesthesia Post-op Note  Anesthesia Post Note  Patient: Erica Mercado  Procedure(s) Performed: * No procedures listed *  Anesthesia type: Epidural  Patient location: Mother/Baby  Post pain: Pain level controlled  Post assessment: Post-op Vital signs reviewed  Last Vitals:  Filed Vitals:   11/24/14 0639  BP: 95/57  Pulse: 67  Temp: 36.5 C  Resp: 20    Post vital signs: Reviewed  Level of consciousness:alert  Complications: No apparent anesthesia complications

## 2014-11-25 MED ORDER — IBUPROFEN 600 MG PO TABS: 600.0000 mg | ORAL_TABLET | Freq: Four times a day (QID) | ORAL | Status: AC

## 2014-11-25 NOTE — Progress Notes (Signed)
PPD #2- SVD  Subjective:   Reports feeling well Tolerating po/ No nausea or vomiting Bleeding is light Pain controlled with Motrin Up ad lib / ambulatory / voiding without problems Newborn: breastfeeding     Objective:   VS: VS:  Filed Vitals:   11/23/14 1817 11/24/14 0639 11/24/14 1829 11/25/14 0653  BP: 124/70 95/57 104/61 112/68  Pulse: 97 67 74 67  Temp: 98.2 F (36.8 C) 97.7 F (36.5 C) 98.2 F (36.8 C) 98.7 F (37.1 C)  TempSrc: Oral Oral Oral Oral  Resp:  20 18 18   Height:      Weight:      SpO2: 100%       LABS:  Recent Labs  11/23/14 0050 11/24/14 0602  WBC 10.0 14.8*  HGB 13.3 11.5*  PLT 189 164   Blood type: --/--/A POS, A POS (12/24 0050) Rubella: Immune (05/13 0000)                I&O: Intake/Output      12/25 0701 - 12/26 0700 12/26 0701 - 12/27 0700   Urine (mL/kg/hr)     Blood     Total Output       Net              Physical Exam: Alert and oriented X3 Abdomen: soft, non-tender, non-distended  Fundus: firm, non-tender, U-2 Perineum: Well approximated, no significant erythema, edema, or drainage; healing well. Lochia: small Extremities: No edema, no calf pain or tenderness    Assessment: PPD #2  G2P1011/ S/P:spontaneous vaginal, 2nd degree laceration Doing well - stable for discharge home   Plan: Discharge home RX's:  Ibuprofen 600mg  po Q 6 hrs prn pain #30 Refill x 0 Routine pp visit in [facility] booklet given Phone interpreter for assessment and discharge instructions  Erica Mercado, Erica Mercado, N MSN, CNM 11/25/2014, 11:02 AM

## 2014-11-25 NOTE — Lactation Note (Signed)
This note was copied from the chart of Erica Mercado. Lactation Consultation Note RN reported difficult latching to breast, mom bottle feeding. Mom was giving a NS but doesn't know where it is right now. Gave #20 NS fitted well. Encouraged to use when l;atching to breast. Baby has limited movement of tongue almost total anterior frenulum. Uncoordinated suck to gloved finger. Latched to breast w/NS. Breast fed 5 min. Then went to sleep. Grandma gave bottle. Hand expressed 1 ml. Colostrum. Encouraged to give to baby. Discussed supply and demand. Pre-pumping to pull nipple out w/hand pump or DEBP, them BF, post-pump. Mom has insurance and wishes to rent DEBP until she gets hers from insurance.  With international [place] interpreter explained renting 14 day pump and return. Explained BF, pumping, cleaning pump, being liable for damages or not returning. Mom speaks English but seems slightly broken at times and wanted to make sure she understood everything. Paid $40.00 cash, gave receipt. Mom encouraged to feed baby 8-12 times/24 hours and with feeding cues. Mom shown how to use DEBP & how to disassemble, clean, & reassemble parts.Hand expression taught to Mom.  Educated about newborn behavior. Mom encouraged to do skin-to-skin and keep baby wrapped so warm during BF. Mom knows to pump q3h for 15-20 min. Referred to Baby and Me Book in Breastfeeding section Pg. 22-23 for position options and Proper latch demonstration. Mom placed in shells and encouraged to wear them between feedings. Asked mom to call for f/u appt. W/LC OP services d/t using NS and espeacially if baby gets anything done to tongue.  Patient Name: Erica Mercado ZOXWR'UToday's Date: 11/25/2014 Reason for consult: Follow-up assessment;Difficult latch   Maternal Data    Feeding Feeding Type: Breast Fed Nipple Type: Slow - flow  LATCH Score/Interventions Latch: Repeated attempts needed to sustain latch, nipple held in mouth throughout  feeding, stimulation needed to elicit sucking reflex. Intervention(s): Skin to skin;Teach feeding cues;Waking techniques Intervention(s): Adjust position;Assist with latch;Breast massage;Breast compression  Audible Swallowing: None Intervention(s): Skin to skin;Hand expression Intervention(s): Alternate breast massage  Type of Nipple: Everted at rest and after stimulation (compresses inwards) Intervention(s): Shells;Hand pump;Double electric pump;Reverse pressure  Comfort (Breast/Nipple): Soft / non-tender     Hold (Positioning): Assistance needed to correctly position infant at breast and maintain latch. Intervention(s): Breastfeeding basics reviewed;Support Pillows;Position options;Skin to skin  LATCH Score: 6  Lactation Tools Discussed/Used Tools: Shells;Nipple Dorris CarnesShields;Pump Nipple shield size: 20 Shell Type: Inverted Breast pump type: Double-Electric Breast Pump Pump Review: Setup, frequency, and cleaning;Milk Storage   Consult Status Consult Status: Complete Date: 11/25/14 Follow-up type: In-patient    Charyl DancerCARVER, Chayah Mckee G 11/25/2014, 11:42 AM

## 2014-11-25 NOTE — Discharge Summary (Signed)
Obstetric Discharge Summary Reason for Admission: rupture of membranes Prenatal Course: uncomplicated, excessive weight gain Intrapartum Procedures: spontaneous vaginal delivery Postpartum Procedures: none Complications-Operative and Postpartum: 2nd degree perineal laceration and sulcus laceration HEMOGLOBIN  Date Value Ref Range Status  11/24/2014 11.5* 12.0 - 15.0 g/dL Final  16/10/960406/28/2014 54.013.1 12.2 - 16.2 g/dL Final   HCT  Date Value Ref Range Status  11/24/2014 35.5* 36.0 - 46.0 % Final   HCT, POC  Date Value Ref Range Status  05/28/2013 40.9 37.7 - 47.9 % Final    Physical Exam:  General: alert and cooperative Lochia: appropriate Uterine Fundus: firm Incision: healing well, no significant drainage, no dehiscence, no significant erythema DVT Evaluation: No evidence of DVT seen on physical exam. Negative Homan's sign. No cords or calf tenderness. No significant calf/ankle edema.  Discharge Diagnoses: Term Pregnancy-delivered  Discharge Information: Date: 11/25/2014 Activity: pelvic rest Diet: routine Medications: PNV and Ibuprofen Condition: stable Instructions: refer to practice specific booklet Discharge to: home Follow-up Information    Follow up with COUSINS,SHERONETTE A, MD. Schedule an appointment as soon as possible for a visit in 6 weeks.   Specialty:  Obstetrics and Gynecology   Contact information:   [address] [place] [id number] [phone]       Newborn Data: Live born female on 11/23/14 Birth Weight: 6 lb 3.5 oz (2820 g) APGAR: 9, 9  Home with mother.  Erica Mercado, Erica Mercado 11/25/2014, 11:40 AM

## 2014-11-26 ENCOUNTER — Inpatient Hospital Stay (HOSPITAL_COMMUNITY): Admission: RE | Admit: 2014-11-26 | Payer: Self-pay | Source: Ambulatory Visit

## 2016-01-27 ENCOUNTER — Ambulatory Visit (INDEPENDENT_AMBULATORY_CARE_PROVIDER_SITE_OTHER): Payer: BLUE CROSS/BLUE SHIELD | Admitting: Emergency Medicine

## 2016-01-27 VITALS — BP 120/80 | HR 82 | Temp 98.4°F | Resp 12 | Ht 65.0 in | Wt 145.0 lb

## 2016-01-27 DIAGNOSIS — J3089 Other allergic rhinitis: Secondary | ICD-10-CM

## 2016-01-27 DIAGNOSIS — R05 Cough: Secondary | ICD-10-CM

## 2016-01-27 DIAGNOSIS — R059 Cough, unspecified: Secondary | ICD-10-CM

## 2016-01-27 MED ORDER — MONTELUKAST SODIUM 10 MG PO TABS: 10.0000 mg | ORAL_TABLET | Freq: Every day | ORAL | Status: AC

## 2016-01-27 MED ORDER — BENZONATATE 100 MG PO CAPS: 100.0000 mg | ORAL_CAPSULE | Freq: Three times a day (TID) | ORAL | Status: AC | PRN

## 2016-01-27 MED ORDER — FLUTICASONE PROPIONATE 50 MCG/ACT NA SUSP: 2.0000 | Freq: Every day | NASAL | Status: AC

## 2016-01-27 MED ORDER — CETIRIZINE HCL 10 MG PO TABS: ORAL_TABLET | ORAL | Status: AC

## 2016-01-27 NOTE — Patient Instructions (Signed)

## 2016-01-27 NOTE — Progress Notes (Signed)
This chart was scribed for Earl Lites, MD by [facility], medical scribe at [facility].The patient was seen in exam room 14 and the patient's care was started at 10:21 AM.  Chief Complaint:  Chief Complaint  Patient presents with  . Cough    2 months yellow discharge in the last 2 weeks   HPI: Erica Mercado is a 30 y.o. female who reports to [facility] today complaining of a cough for two months. However, over the past two weeks her cough has been producing a yellow phlegm. In addition to her cough her allergies worsened she began taking zyrtec for this but no inhaler. She usually has issues with allergies during the spring. She doe not have a fever, nasal congestion, or a sore throat. She does not smoke.  Past Medical History  Diagnosis Date  . Allergy    No past surgical history on file. Social History   Social History  . Marital Status: Unknown    Spouse Name: N/A  . Number of Children: 0  . Years of Education: N/A   Occupational History  .      nail technician   Social History Main Topics  . Smoking status: Never Smoker   . Smokeless tobacco: None  . Alcohol Use: No  . Drug Use: No  . Sexual Activity: Yes   Other Topics Concern  . None   Social History Narrative   Marital status:     No family history on file. Allergies  Allergen Reactions  . Avocado Itching   Prior to Admission medications   Medication Sig Start Date End Date Taking? Authorizing Provider  cetirizine (ZYRTEC) 10 MG tablet Take 1 tablet (10 mg total) by mouth daily. NO MORE REFILLS WITHOUT OFFICE VISIT - 2ND NOTICE 11/21/14  Yes Morrell Riddle, PA-C  ibuprofen (ADVIL,MOTRIN) 600 MG tablet Take 1 tablet (600 mg total) by mouth every 6 (six) hours. 11/25/14  Yes Lawernce Pitts, CNM   ROS: The patient denies fevers, chills, night sweats, unintentional weight loss, chest pain, palpitations, wheezing, dyspnea on exertion, nausea, vomiting, abdominal pain, dysuria, hematuria,  melena, numbness, weakness, or tingling.  All other systems have been reviewed and were otherwise negative with the exception of those mentioned in the HPI and as above.    PHYSICAL EXAM: Filed Vitals:   01/27/16 0955  BP: 120/80  Pulse: 82  Temp: 98.4 F (36.9 C)  Resp: 12   Body mass index is 24.13 kg/(m^2).  General: Alert, no acute distress HEENT:  Normocephalic, atraumatic, oropharynx patent. Nasal congestion with blue turbinates, consistent with allergies.  Eye: Nonie Hoyer [facility] Cardiovascular:  Regular rate and rhythm, no rubs murmurs or gallops.  No Carotid bruits, radial pulse intact. No pedal edema.  Respiratory: Clear to auscultation bilaterally.  No wheezes, rales, or rhonchi.  No cyanosis, no use of accessory musculature. Poor air exchange. Abdominal: No organomegaly, abdomen is soft and non-tender, positive bowel sounds.  No masses. Musculoskeletal: Gait intact. No edema, tenderness Skin: No rashes. Neurologic: Facial musculature symmetric. Psychiatric: Patient acts appropriately throughout our interaction. Lymphatic: No cervical or submandibular lymphadenopathy Genitourinary/Anorectal: No acute findings  LABS: Results for orders placed or performed during the hospital encounter of 11/22/14  CBC  Result Value Ref Range   WBC 10.0 4.0 - 10.5 K/uL   RBC 4.52 3.87 - 5.11 MIL/uL   Hemoglobin 13.3 12.0 - 15.0 g/dL   HCT 40.9 81.1 - 91.4 %   MCV 88.5  78.0 - 100.0 fL   MCH 29.4 26.0 - 34.0 pg   MCHC 33.3 30.0 - 36.0 g/dL   RDW 16.1 09.6 - 04.5 %   Platelets 189 150 - 400 K/uL  RPR  Result Value Ref Range   RPR Ser Ql NON REAC NON REAC  CBC  Result Value Ref Range   WBC 14.8 (H) 4.0 - 10.5 K/uL   RBC 3.98 3.87 - 5.11 MIL/uL   Hemoglobin 11.5 (L) 12.0 - 15.0 g/dL   HCT 40.9 (L) 81.1 - 91.4 %   MCV 89.2 78.0 - 100.0 fL   MCH 28.9 26.0 - 34.0 pg   MCHC 32.4 30.0 - 36.0 g/dL   RDW 78.2 95.6 - 21.3 %   Platelets 164 150 - 400 K/uL  Fern Test  Result Value Ref  Range   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 Cleta Alberts at [facility]  ASSESSMENT/PLAN: No signs of asthma on her pulmonary function test. Will treat with Zyrtec in the morning Singulair at night and Flonase during the day and Tessalon Perles as needed. Gross sideeffects, risk and benefits, and alternatives of medications d/w patient. Patient is aware that all medications have potential sideeffects and we are unable to predict every sideeffect or drug-drug interaction that may occur.  By signing my name below, I, Nadim Abuhashem, attest that this documentation has been prepared under the direction and in the presence of Earl Lites, MD.  Electronically Signed: Conchita Paris, medical scribe. 01/27/2016, 10:27 AM.  Lesle Chris MD 01/27/2016 10:21 AM

## 2020-10-12 ENCOUNTER — Other Ambulatory Visit: Payer: Self-pay | Admitting: Obstetrics and Gynecology

## 2020-10-12 DIAGNOSIS — N644 Mastodynia: Secondary | ICD-10-CM

## 2020-11-14 ENCOUNTER — Ambulatory Visit
Admission: RE | Admit: 2020-11-14 | Discharge: 2020-11-14 | Disposition: A | Discharge status: Home / Self Care | Payer: 59 | Source: Ambulatory Visit | Attending: Obstetrics and Gynecology | Admitting: Obstetrics and Gynecology

## 2020-11-14 ENCOUNTER — Other Ambulatory Visit: Payer: Self-pay

## 2020-11-14 ENCOUNTER — Other Ambulatory Visit: Payer: Self-pay | Admitting: Obstetrics and Gynecology

## 2020-11-14 DIAGNOSIS — N644 Mastodynia: Secondary | ICD-10-CM

## 2021-05-16 ENCOUNTER — Ambulatory Visit
Admission: RE | Admit: 2021-05-16 | Discharge: 2021-05-16 | Disposition: A | Discharge status: Home / Self Care | Payer: 59 | Source: Ambulatory Visit | Attending: Obstetrics and Gynecology | Admitting: Obstetrics and Gynecology

## 2021-05-16 ENCOUNTER — Other Ambulatory Visit: Payer: Self-pay

## 2021-05-16 ENCOUNTER — Other Ambulatory Visit: Payer: Self-pay | Admitting: Obstetrics and Gynecology

## 2021-05-16 ENCOUNTER — Other Ambulatory Visit: Payer: 59

## 2021-05-16 DIAGNOSIS — N644 Mastodynia: Secondary | ICD-10-CM

## 2021-10-22 IMAGING — US US BREAST*L* LIMITED INC AXILLA
1 series · 13 of 13 positions shown · non-contrast
Comparison: None.

CLINICAL DATA: 34-year-old female with diffuse RIGHT breast pain,
which has now improved.

EXAM:
DIGITAL DIAGNOSTIC BILATERAL MAMMOGRAM WITH CAD AND TOMO
ULTRASOUND LEFT BREAST

[Series 1: us breast*left* limited inc axilla · 0.05mm/px · 13 of 13 slices shown]
[im 1/13]
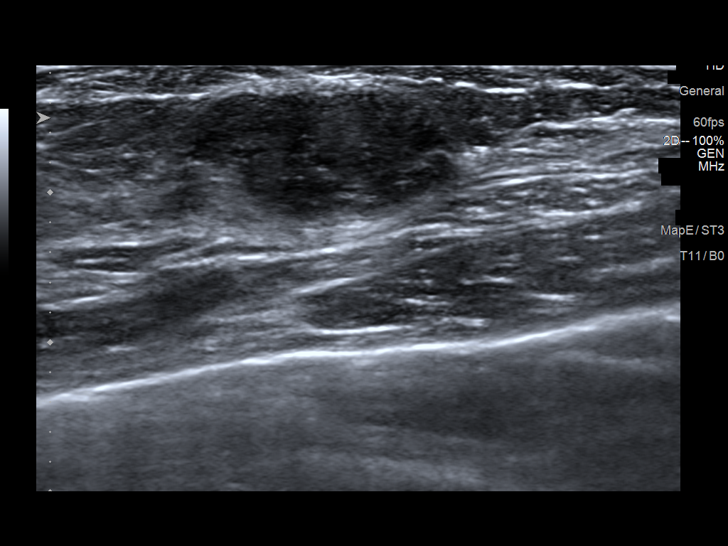
[im 2/13]
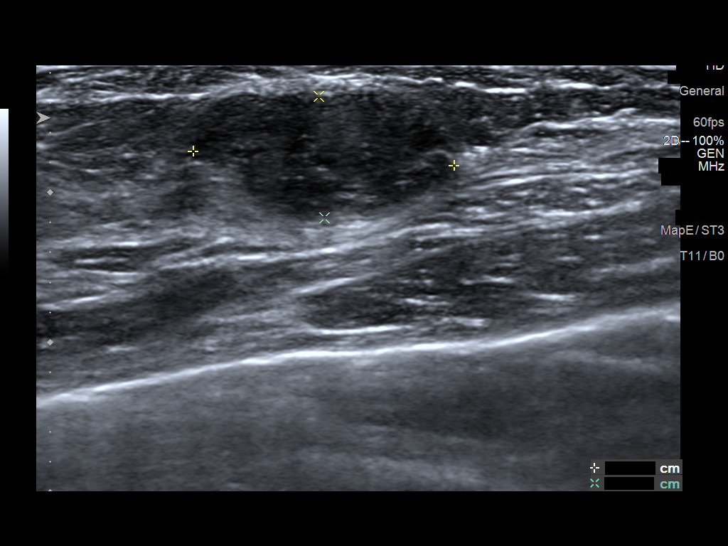
[im 3/13]
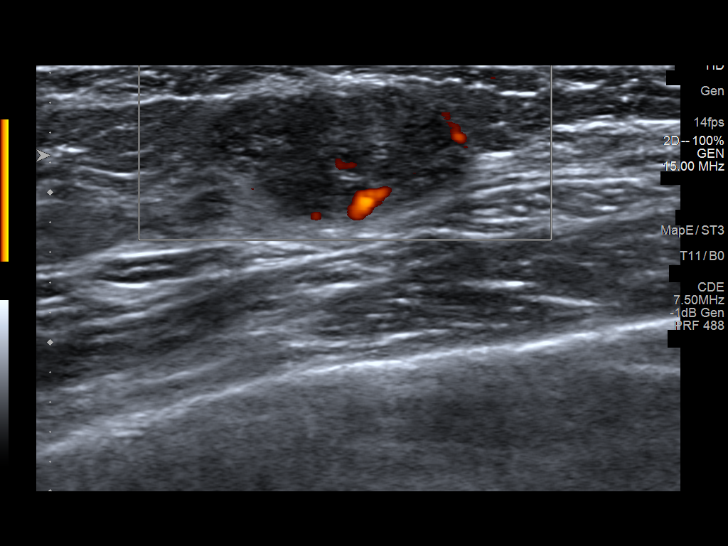
[im 4/13]
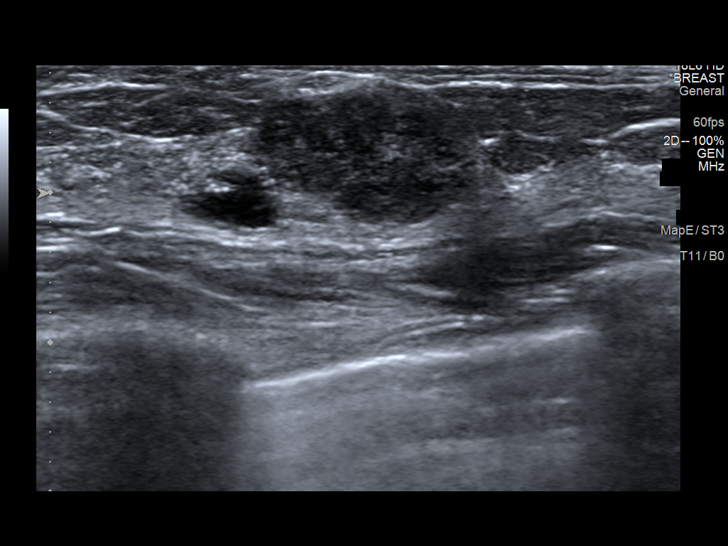
[im 5/13]
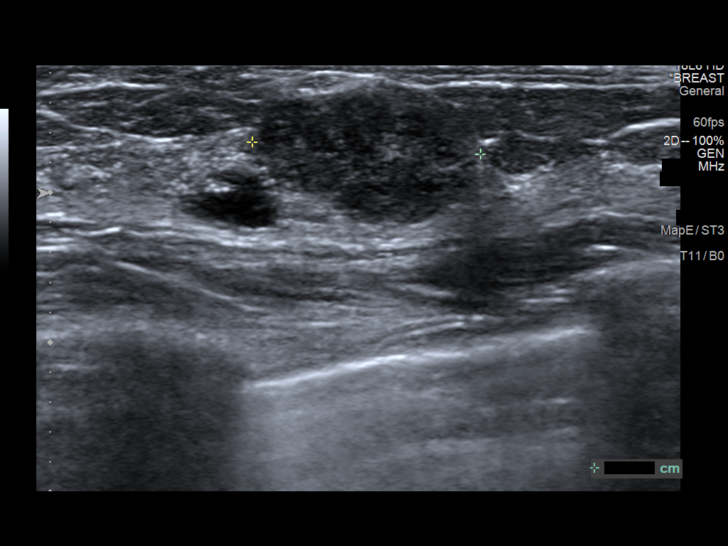
[im 6/13]
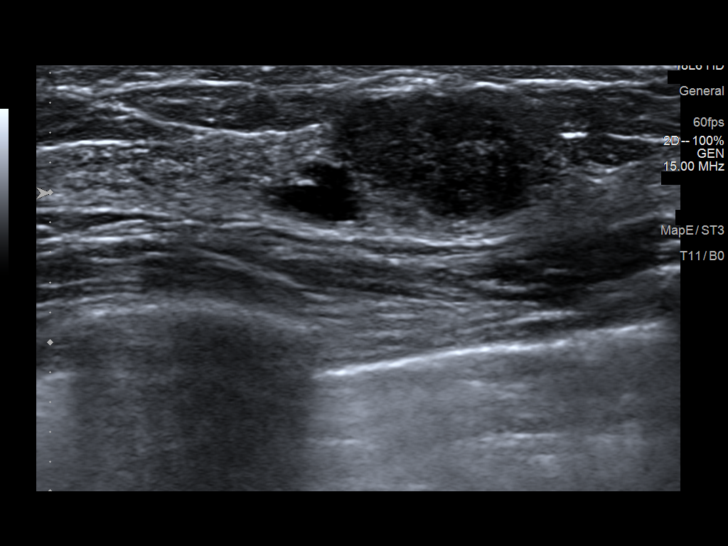
[im 7/13]
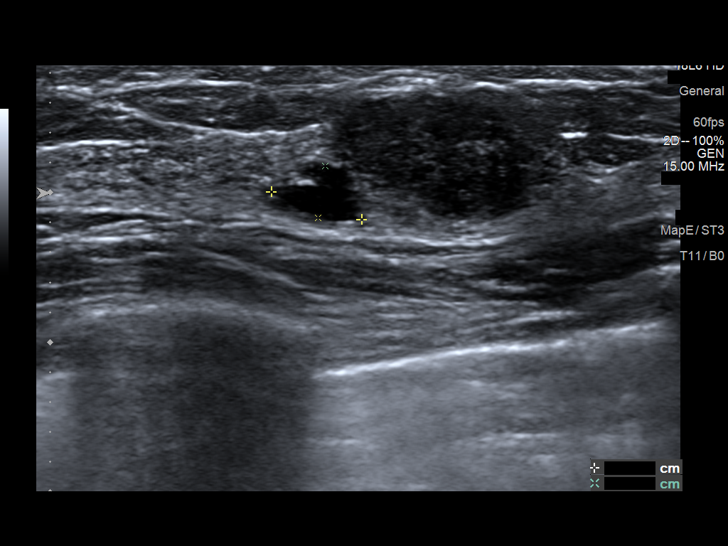
[im 8/13]
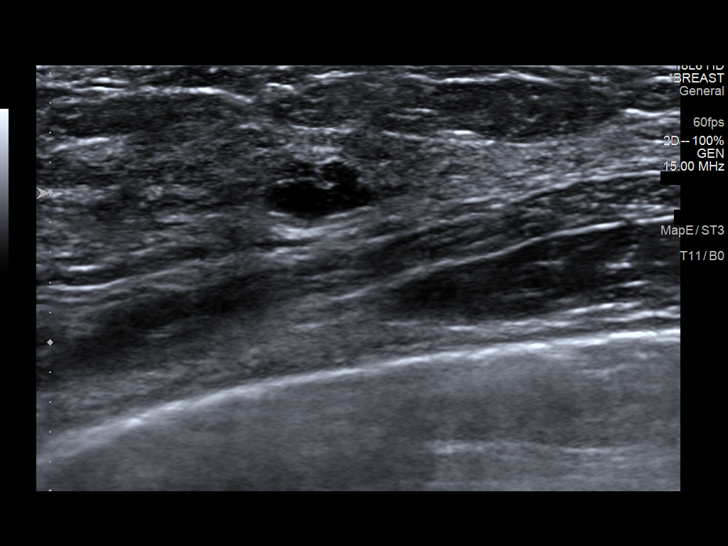
[im 9/13]
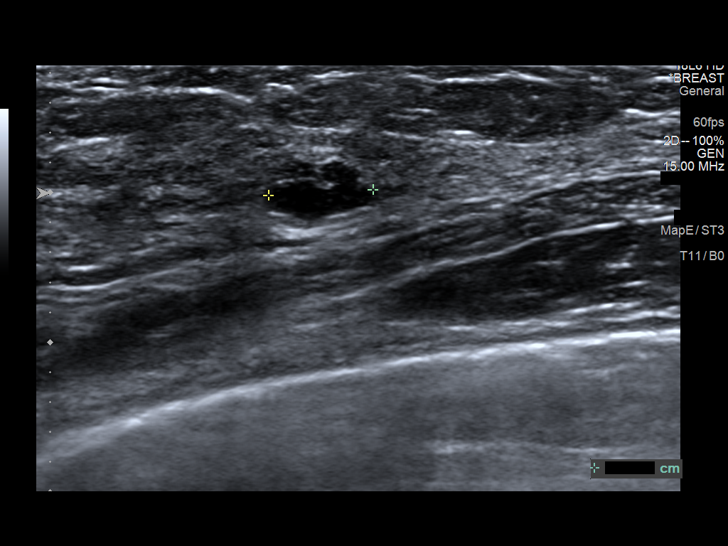
[im 10/13]
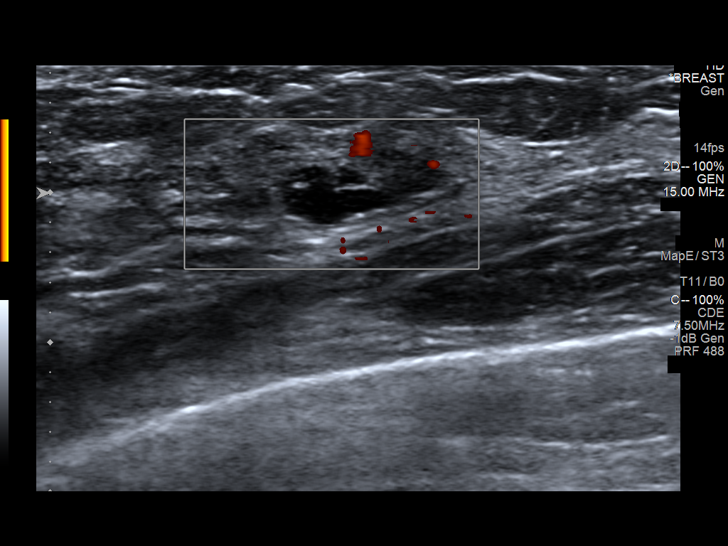
[im 11/13]
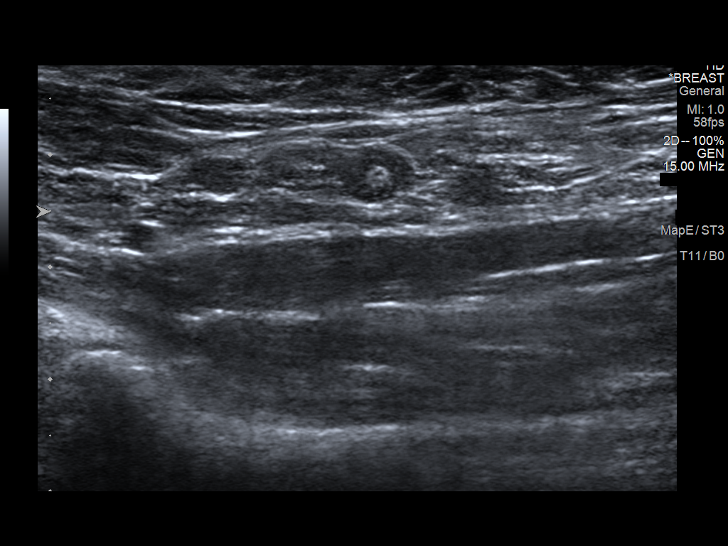
[im 12/13]
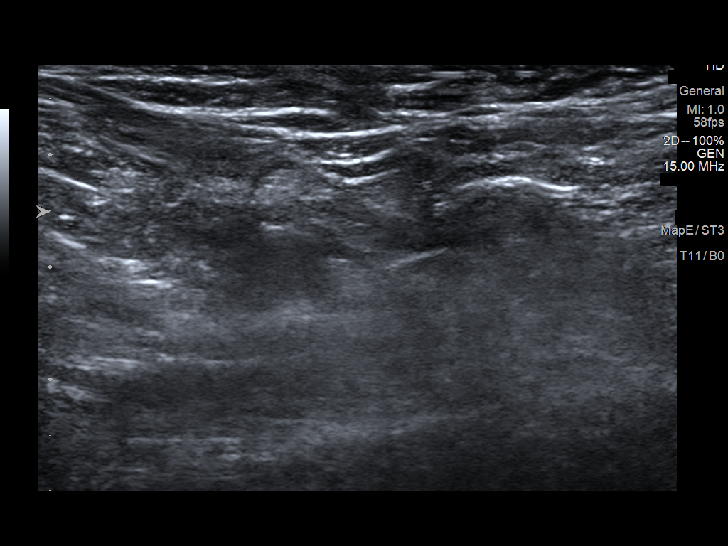
[im 13/13]
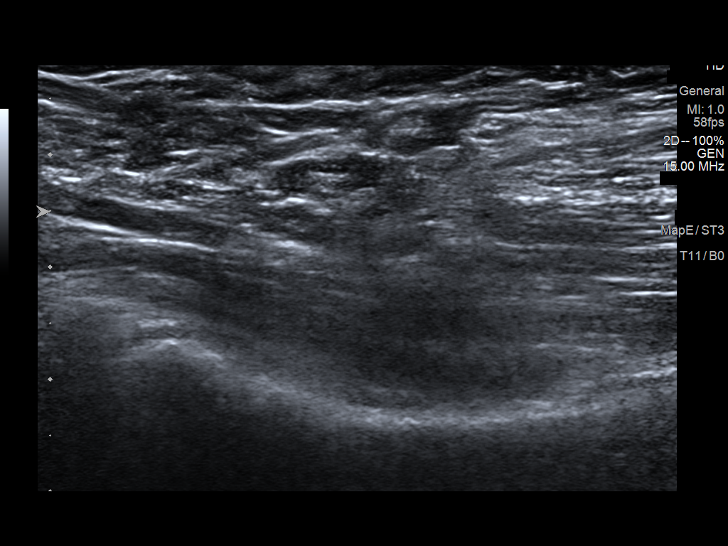

[13 of 13 positions shown; findings below may reference images not displayed]

ACR Breast Density Category d: The breast tissue is extremely dense,
which lowers the sensitivity of mammography.
FINDINGS: 2D/3D full field and spot compression views of both breasts
demonstrate a circumscribed oval mass within the OUTER LEFT breast.

No other mammographic abnormalities are noted within either breast.

Mammographic images were processed with CAD.

On physical exam, a firm palpable mobile mass at the 3 o'clock
position of the LEFT breast 3 cm from the nipple.

Targeted ultrasound is performed, showing a 1.7 x 0.8 x 1.5 cm
circumscribed oval hypoechoic parallel mass at the 3 o'clock
position of the LEFT breast 3 cm from the nipple. A 0.7 cm
complicated cyst adjacent to this mass is noted.

No abnormal LEFT axillary lymph nodes are noted.
IMPRESSION: 1. 1.7 cm likely benign mass/probable fibroadenoma within the OUTER
LEFT breast. The patient states this area has been present for many
years. Options of short-term follow-up and tissue sampling were
presented to the patient, who desires short-term follow-up.
2. No suspicious mammographic findings within either breast.

RECOMMENDATION:
LEFT breast ultrasound in 6 months.

I have discussed the findings, causes of breast pain and
recommendations with the patient. If applicable, a reminder letter
will be sent to the patient regarding the next appointment.

BI-RADS CATEGORY  3: Probably benign.

## 2021-11-15 ENCOUNTER — Other Ambulatory Visit: Payer: 59

## 2022-04-23 IMAGING — US US BREAST*L* LIMITED INC AXILLA
1 series · 11 of 11 positions shown · non-contrast
Comparison: Previous exam(s).

CLINICAL DATA: 35-year-old female presenting for six-month
follow-up of a probably benign left breast mass.

EXAM:
ULTRASOUND OF THE LEFT BREAST

[Series 1: us breast*left* limited inc axilla · 0.06mm/px · 11 of 11 slices shown]
[im 1/11]
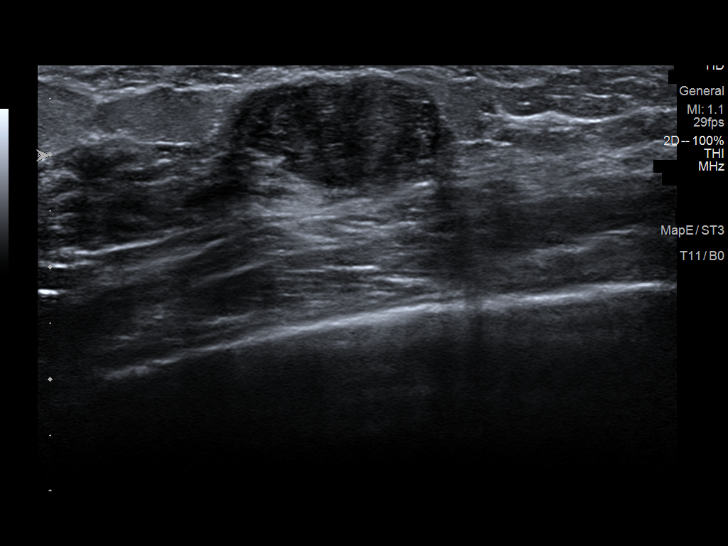
[im 2/11]
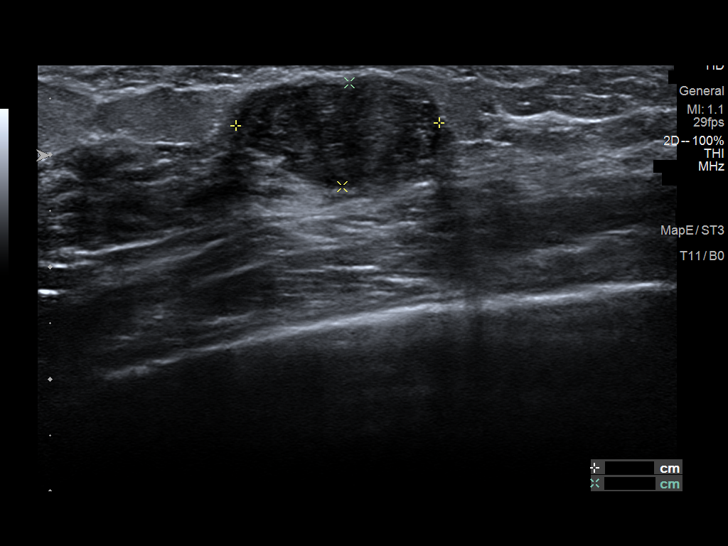
[im 3/11]
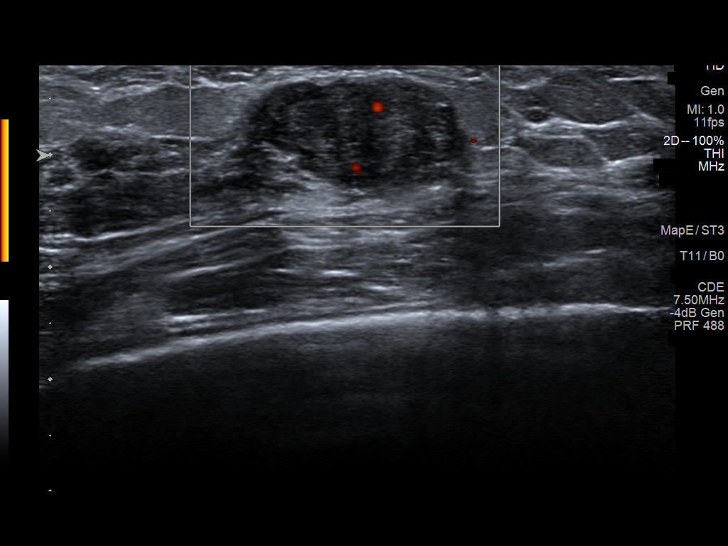
[im 4/11]
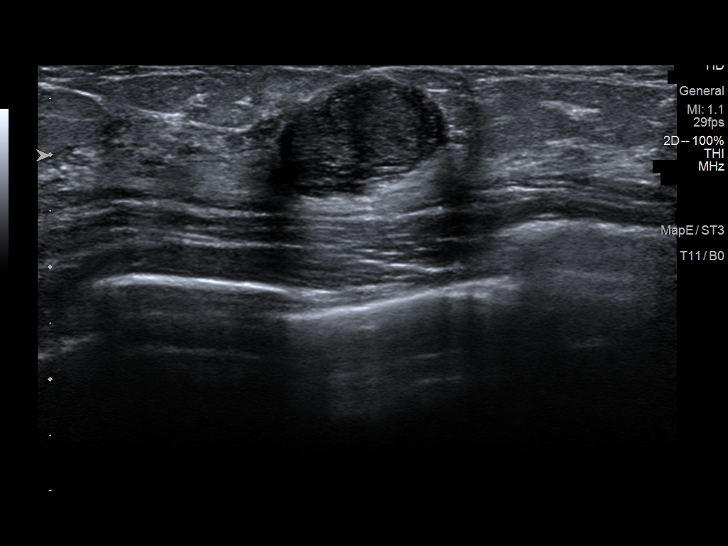
[im 5/11]
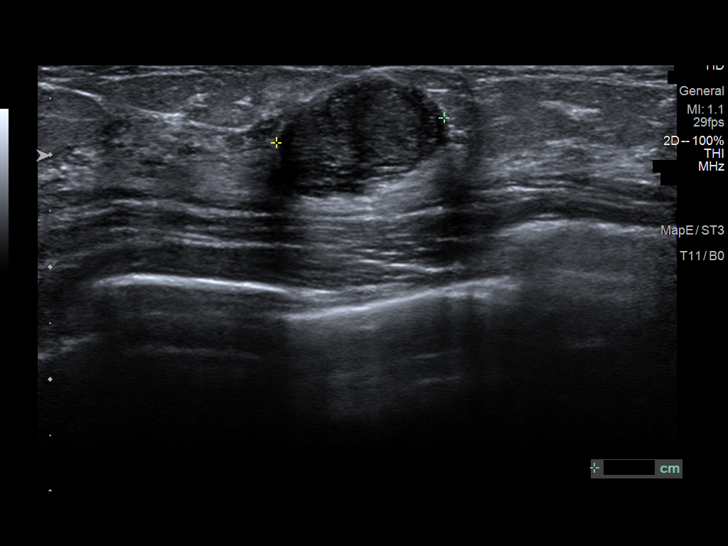
[im 6/11]
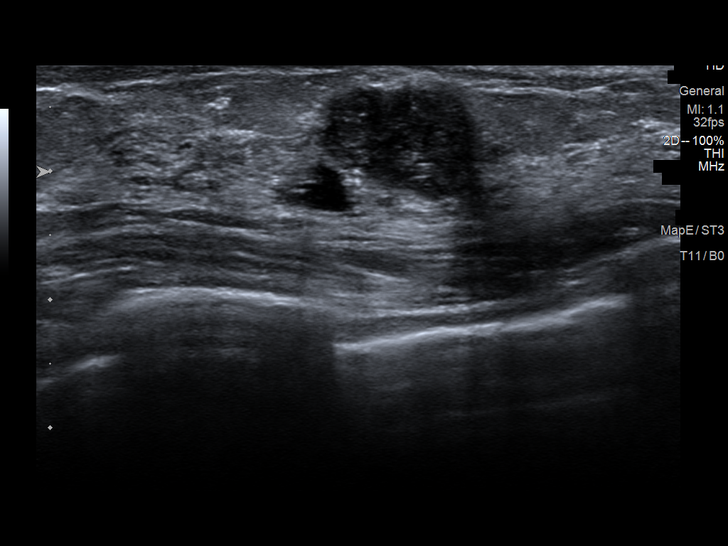
[im 7/11]
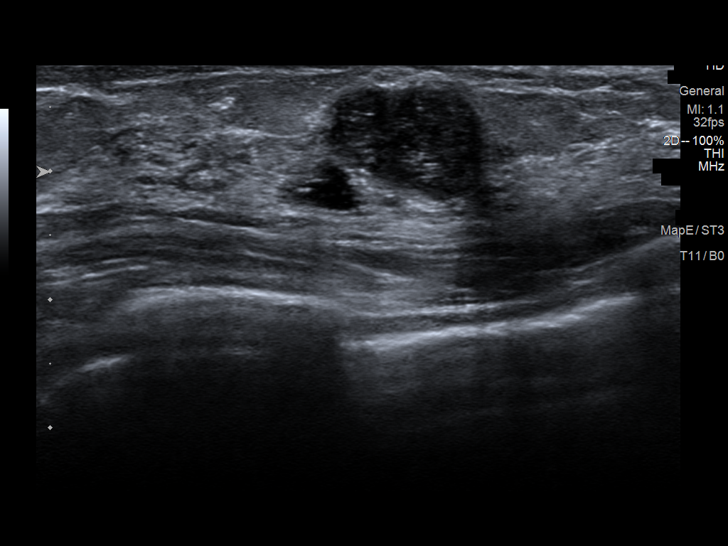
[im 8/11]
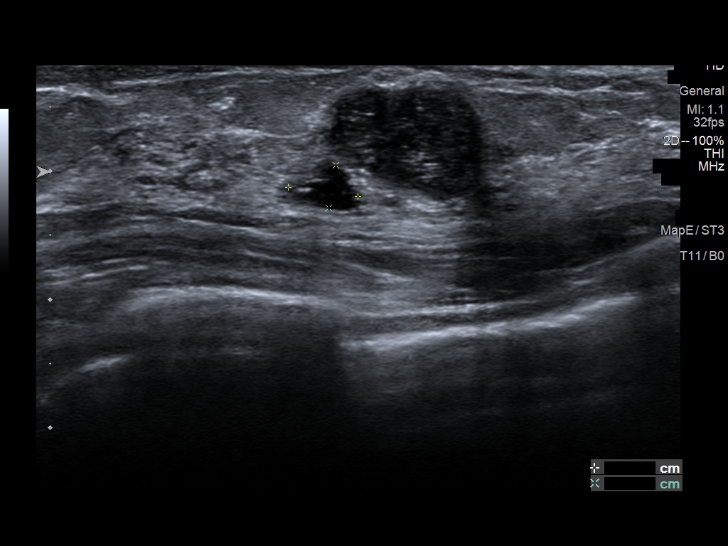
[im 9/11]
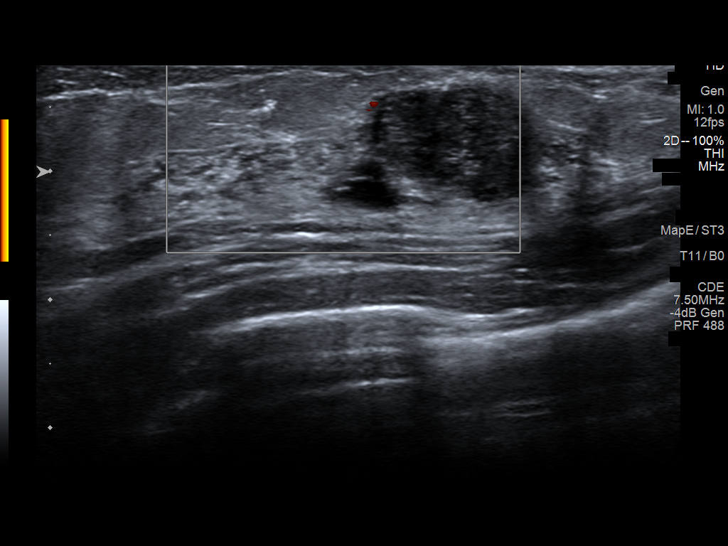
[im 10/11]
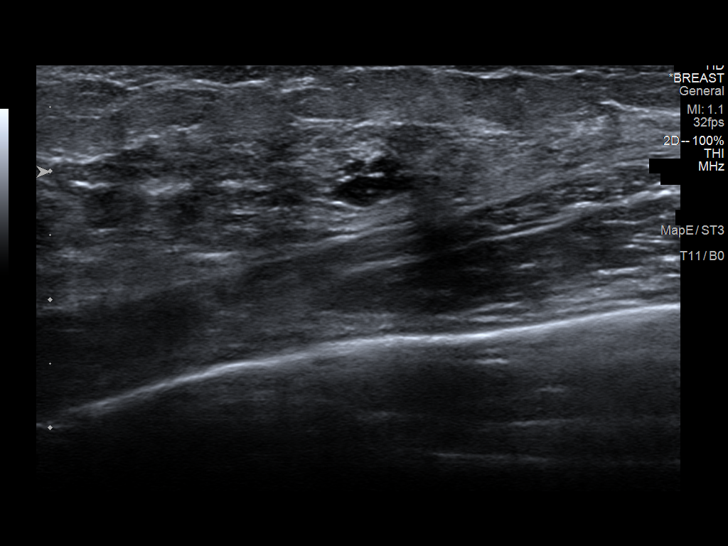
[im 11/11]
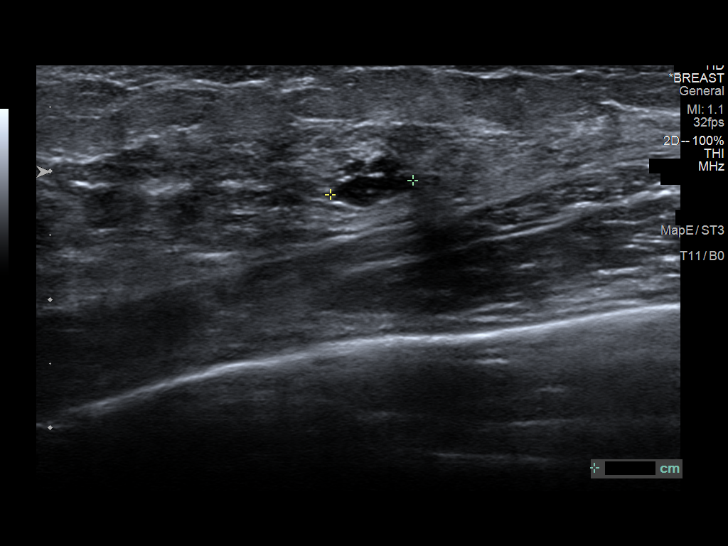

[11 of 11 positions shown; findings below may reference images not displayed]

FINDINGS: Targeted ultrasound is performed in the left breast at 3 o'clock 3
cm from the nipple demonstrating an oval circumscribed hypoechoic
mass measuring 1.8 x 0.9 x 1.5 cm, previously measuring 1.7 x 0.8 x
1.5 cm. Adjacent complicated cyst again noted, not significantly
changed.
IMPRESSION: Stable probably benign left breast mass at 3 o'clock, likely a
fibroadenoma.

RECOMMENDATION:
Left breast ultrasound in 6 months.

Continue to monitor for interval growth at home.

I have discussed the findings and recommendations with the patient.
If applicable, a reminder letter will be sent to the patient
regarding the next appointment.

BI-RADS CATEGORY  3: Probably benign.

## 2023-11-04 ENCOUNTER — Other Ambulatory Visit: Payer: Self-pay | Admitting: Obstetrics and Gynecology

## 2023-11-04 DIAGNOSIS — N6320 Unspecified lump in the left breast, unspecified quadrant: Secondary | ICD-10-CM

## 2023-11-23 ENCOUNTER — Ambulatory Visit
Admission: RE | Admit: 2023-11-23 | Discharge: 2023-11-23 | Disposition: A | Discharge status: Home / Self Care | Payer: 59 | Source: Ambulatory Visit | Attending: Obstetrics and Gynecology | Admitting: Obstetrics and Gynecology

## 2023-11-23 ENCOUNTER — Ambulatory Visit
Admission: RE | Admit: 2023-11-23 | Discharge: 2023-11-23 | Disposition: A | Discharge status: Home / Self Care | Payer: No Typology Code available for payment source | Source: Ambulatory Visit | Attending: Obstetrics and Gynecology

## 2023-11-23 DIAGNOSIS — N6320 Unspecified lump in the left breast, unspecified quadrant: Secondary | ICD-10-CM
